# Patient Record
Sex: Male | Born: 1955 | Race: Black or African American | Hispanic: No | Marital: Married | Smoking: Never smoker
Health system: Southern US, Community
[De-identification: ages and names within clinical notes are randomized; demographics above are authoritative.]

## PROBLEM LIST (undated history)

## (undated) DIAGNOSIS — I10 Essential (primary) hypertension: Secondary | ICD-10-CM

## (undated) DIAGNOSIS — H409 Unspecified glaucoma: Secondary | ICD-10-CM

## (undated) HISTORY — PX: EYE SURGERY: SHX253

## (undated) HISTORY — PX: GLAUCOMA SURGERY: SHX656

---

## 2005-07-23 ENCOUNTER — Ambulatory Visit: Payer: Self-pay | Admitting: Internal Medicine

## 2005-09-07 ENCOUNTER — Ambulatory Visit: Payer: Self-pay | Admitting: Internal Medicine

## 2005-12-28 ENCOUNTER — Ambulatory Visit: Payer: Self-pay | Admitting: Internal Medicine

## 2006-05-13 ENCOUNTER — Ambulatory Visit: Payer: Self-pay | Admitting: Internal Medicine

## 2006-06-12 ENCOUNTER — Ambulatory Visit: Payer: Self-pay | Admitting: *Deleted

## 2006-06-18 ENCOUNTER — Ambulatory Visit: Payer: Self-pay | Admitting: Family Medicine

## 2006-07-03 ENCOUNTER — Ambulatory Visit: Payer: Self-pay | Admitting: Family Medicine

## 2007-01-29 ENCOUNTER — Encounter (INDEPENDENT_AMBULATORY_CARE_PROVIDER_SITE_OTHER): Payer: Self-pay | Admitting: *Deleted

## 2010-11-23 ENCOUNTER — Ambulatory Visit (INDEPENDENT_AMBULATORY_CARE_PROVIDER_SITE_OTHER): Payer: Self-pay | Admitting: General Surgery

## 2013-03-06 ENCOUNTER — Encounter (HOSPITAL_COMMUNITY): Payer: Self-pay | Admitting: Emergency Medicine

## 2013-03-06 ENCOUNTER — Emergency Department (HOSPITAL_COMMUNITY)
Admission: EM | Admit: 2013-03-06 | Discharge: 2013-03-06 | Disposition: A | Discharge status: Home / Self Care | Payer: Managed Care, Other (non HMO) | Attending: Emergency Medicine | Admitting: Emergency Medicine

## 2013-03-06 ENCOUNTER — Emergency Department (HOSPITAL_COMMUNITY): Payer: Managed Care, Other (non HMO)

## 2013-03-06 DIAGNOSIS — Z79899 Other long term (current) drug therapy: Secondary | ICD-10-CM | POA: Insufficient documentation

## 2013-03-06 DIAGNOSIS — S298XXA Other specified injuries of thorax, initial encounter: Secondary | ICD-10-CM | POA: Insufficient documentation

## 2013-03-06 DIAGNOSIS — Y9389 Activity, other specified: Secondary | ICD-10-CM | POA: Insufficient documentation

## 2013-03-06 DIAGNOSIS — I10 Essential (primary) hypertension: Secondary | ICD-10-CM | POA: Insufficient documentation

## 2013-03-06 DIAGNOSIS — Y92410 Unspecified street and highway as the place of occurrence of the external cause: Secondary | ICD-10-CM | POA: Insufficient documentation

## 2013-03-06 HISTORY — DX: Essential (primary) hypertension: I10
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
## 2013-03-07 NOTE — ED Provider Notes (Signed)
Medical screening examination/treatment/procedure(s) were performed by non-physician practitioner and as supervising physician I was immediately available for consultation/collaboration.    Tanairy Payeur, MD 03/07/13 0603 

## 2014-04-29 ENCOUNTER — Ambulatory Visit: Payer: Managed Care, Other (non HMO) | Admitting: Cardiovascular Disease

## 2015-04-06 ENCOUNTER — Other Ambulatory Visit: Payer: Self-pay | Admitting: Orthopedic Surgery

## 2015-04-18 NOTE — Pre-Procedure Instructions (Signed)
    [name]  04/18/2015      [id number] [name] [address] [facility] [id number] [name] [id number] Phone: [phone] Fax: [phone]    Your procedure is scheduled on 04/29/15.  [facility].M.  Call this number if you have problems the morning of surgery:  [phone]   Remember:  Do not eat food or drink liquids after midnight.  Take these medicines the morning of surgery with A SIP OF WATER --eye drops   Do not wear jewelry, make-up or nail polish.  Do not wear lotions, powders, or perfumes.  You may wear deodorant.  Do not shave 48 hours prior to surgery.  Men may shave face and neck.  Do not bring valuables to the hospital.  [facility] is not responsible for any belongings or valuables.  Contacts, dentures or bridgework may not be worn into surgery.  Leave your suitcase in the car.  After surgery it may be brought to your room.  For patients admitted to the hospital, discharge time will be determined by your treatment team.  Patients discharged the day of surgery will not be allowed to drive home.   Name and phone number of your driver:   Special instructions:    Please read over the following fact sheets that you were given. Pain Booklet, Coughing and Deep Breathing, MRSA Information and Surgical Site Infection Prevention

## 2015-04-19 ENCOUNTER — Encounter (HOSPITAL_COMMUNITY)
Admission: RE | Admit: 2015-04-19 | Discharge: 2015-04-19 | Disposition: A | Discharge status: Home / Self Care | Payer: BLUE CROSS/BLUE SHIELD | Source: Ambulatory Visit | Attending: Orthopedic Surgery | Admitting: Orthopedic Surgery

## 2015-04-19 ENCOUNTER — Encounter (HOSPITAL_COMMUNITY): Payer: Self-pay

## 2015-04-19 ENCOUNTER — Other Ambulatory Visit: Payer: Self-pay

## 2015-04-19 DIAGNOSIS — Z01810 Encounter for preprocedural cardiovascular examination: Secondary | ICD-10-CM | POA: Insufficient documentation

## 2015-04-19 DIAGNOSIS — Z01818 Encounter for other preprocedural examination: Secondary | ICD-10-CM | POA: Diagnosis present

## 2015-04-19 DIAGNOSIS — Z01812 Encounter for preprocedural laboratory examination: Secondary | ICD-10-CM | POA: Insufficient documentation

## 2015-04-19 HISTORY — DX: Unspecified glaucoma: H40.9

## 2015-04-19 LAB — CBC WITH DIFFERENTIAL/PLATELET
BASOS ABS: 0.1 10*3/uL (ref 0.0–0.1)
BASOS PCT: 1 %
Eosinophils Absolute: 0.5 10*3/uL (ref 0.0–0.7)
Eosinophils Relative: 9 %
HCT: 43.1 % (ref 39.0–52.0)
HEMOGLOBIN: 14.6 g/dL (ref 13.0–17.0)
Lymphocytes Relative: 39 %
Lymphs Abs: 2.3 10*3/uL (ref 0.7–4.0)
MCH: 28.2 pg (ref 26.0–34.0)
MCHC: 33.9 g/dL (ref 30.0–36.0)
MCV: 83.4 fL (ref 78.0–100.0)
Monocytes Absolute: 0.5 10*3/uL (ref 0.1–1.0)
Monocytes Relative: 8 %
NEUTROS ABS: 2.6 10*3/uL (ref 1.7–7.7)
NEUTROS PCT: 43 %
Platelets: 172 10*3/uL (ref 150–400)
RBC: 5.17 MIL/uL (ref 4.22–5.81)
RDW: 13 % (ref 11.5–15.5)
WBC: 6 10*3/uL (ref 4.0–10.5)

## 2015-04-19 LAB — ABO/RH: ABO/RH(D): O POS

## 2015-04-19 LAB — TYPE AND SCREEN
ABO/RH(D): O POS
Antibody Screen: NEGATIVE

## 2015-04-19 LAB — URINALYSIS, ROUTINE W REFLEX MICROSCOPIC
Bilirubin Urine: NEGATIVE
GLUCOSE, UA: NEGATIVE mg/dL
Hgb urine dipstick: NEGATIVE
KETONES UR: NEGATIVE mg/dL
Leukocytes, UA: NEGATIVE
Nitrite: NEGATIVE
PH: 5.5 (ref 5.0–8.0)
Protein, ur: NEGATIVE mg/dL
Specific Gravity, Urine: 1.026 (ref 1.005–1.030)

## 2015-04-19 LAB — COMPREHENSIVE METABOLIC PANEL
ALBUMIN: 3.8 g/dL (ref 3.5–5.0)
ALK PHOS: 80 U/L (ref 38–126)
ALT: 28 U/L (ref 17–63)
AST: 36 U/L (ref 15–41)
Anion gap: 7 (ref 5–15)
BUN: 17 mg/dL (ref 6–20)
CO2: 27 mmol/L (ref 22–32)
Calcium: 9.9 mg/dL (ref 8.9–10.3)
Chloride: 106 mmol/L (ref 101–111)
Creatinine, Ser: 0.89 mg/dL (ref 0.61–1.24)
GFR calc Af Amer: >60 mL/min (ref >60)
Glucose, Bld: 108 mg/dL — ABNORMAL HIGH (ref 65–99)
POTASSIUM: 3.7 mmol/L (ref 3.5–5.1)
Sodium: 140 mmol/L (ref 135–145)
Total Bilirubin: 0.6 mg/dL (ref 0.3–1.2)
Total Protein: 7.1 g/dL (ref 6.5–8.1)

## 2015-04-19 LAB — PROTIME-INR
INR: 1.14 (ref 0.00–1.49)
PROTHROMBIN TIME: 14.7 s (ref 11.6–15.2)

## 2015-04-19 LAB — APTT: APTT: 30 s (ref 24–37)

## 2015-04-19 LAB — SURGICAL PCR SCREEN
MRSA, PCR: NEGATIVE
Staphylococcus aureus: POSITIVE — AB

## 2015-04-20 ENCOUNTER — Other Ambulatory Visit: Payer: Self-pay | Admitting: Orthopedic Surgery

## 2015-04-20 DIAGNOSIS — R9389 Abnormal findings on diagnostic imaging of other specified body structures: Secondary | ICD-10-CM

## 2015-04-28 ENCOUNTER — Ambulatory Visit
Admission: RE | Admit: 2015-04-28 | Discharge: 2015-04-28 | Disposition: A | Discharge status: Home / Self Care | Payer: BLUE CROSS/BLUE SHIELD | Source: Ambulatory Visit | Attending: Orthopedic Surgery | Admitting: Orthopedic Surgery

## 2015-04-28 DIAGNOSIS — R9389 Abnormal findings on diagnostic imaging of other specified body structures: Secondary | ICD-10-CM

## 2015-04-28 MED ORDER — IOPAMIDOL (ISOVUE-300) INJECTION 61%
75.0000 mL | Freq: Once | INTRAVENOUS | Status: AC | PRN
  Administered 2015-04-28: 75 mL via INTRAVENOUS

## 2015-04-29 ENCOUNTER — Encounter (HOSPITAL_COMMUNITY): Payer: Self-pay

## 2015-04-29 ENCOUNTER — Inpatient Hospital Stay (HOSPITAL_COMMUNITY)
Admission: RE | Admit: 2015-04-29 | Discharge: 2015-04-30 | DRG: 470 | Disposition: A | Discharge status: Home Health | Payer: BLUE CROSS/BLUE SHIELD | Source: Ambulatory Visit | Attending: Orthopedic Surgery | Admitting: Orthopedic Surgery

## 2015-04-29 ENCOUNTER — Encounter (HOSPITAL_COMMUNITY)
Admission: RE | Disposition: A | Discharge status: Home Health | Payer: Self-pay | Source: Ambulatory Visit | Attending: Orthopedic Surgery

## 2015-04-29 ENCOUNTER — Inpatient Hospital Stay (HOSPITAL_COMMUNITY): Payer: BLUE CROSS/BLUE SHIELD | Admitting: Anesthesiology

## 2015-04-29 DIAGNOSIS — I10 Essential (primary) hypertension: Secondary | ICD-10-CM | POA: Diagnosis present

## 2015-04-29 DIAGNOSIS — H409 Unspecified glaucoma: Secondary | ICD-10-CM | POA: Diagnosis present

## 2015-04-29 DIAGNOSIS — M1712 Unilateral primary osteoarthritis, left knee: Secondary | ICD-10-CM | POA: Diagnosis present

## 2015-04-29 DIAGNOSIS — M25562 Pain in left knee: Secondary | ICD-10-CM | POA: Diagnosis present

## 2015-04-29 HISTORY — PX: TOTAL KNEE ARTHROPLASTY: SHX125

## 2015-04-29 SURGERY — ARTHROPLASTY, KNEE, TOTAL
Anesthesia: Monitor Anesthesia Care | Site: Knee | Laterality: Left

## 2015-04-29 MED ORDER — BUPIVACAINE LIPOSOME 1.3 % IJ SUSP
20.0000 mL | INTRAMUSCULAR | Status: DC
  Filled 2015-04-29: qty 20

## 2015-04-29 MED ORDER — OXYCODONE-ACETAMINOPHEN 5-325 MG PO TABS: 1.0000 | ORAL_TABLET | ORAL | Status: AC | PRN

## 2015-04-29 MED ORDER — DIPHENHYDRAMINE HCL 12.5 MG/5ML PO ELIX: 12.5000 mg | ORAL_SOLUTION | ORAL | Status: DC | PRN

## 2015-04-29 MED ORDER — ACETAMINOPHEN 325 MG PO TABS: 650.0000 mg | ORAL_TABLET | Freq: Four times a day (QID) | ORAL | Status: DC | PRN

## 2015-04-29 MED ORDER — CEFAZOLIN SODIUM-DEXTROSE 2-3 GM-% IV SOLR
2.0000 g | Freq: Four times a day (QID) | INTRAVENOUS | Status: AC
  Administered 2015-04-29 (×2): 2 g via INTRAVENOUS
  Filled 2015-04-29: qty 50

## 2015-04-29 MED ORDER — DOCUSATE SODIUM 100 MG PO CAPS
100.0000 mg | ORAL_CAPSULE | Freq: Two times a day (BID) | ORAL | Status: DC
  Administered 2015-04-29 – 2015-04-30 (×2): 100 mg via ORAL
  Filled 2015-04-29 (×2): qty 1

## 2015-04-29 MED ORDER — PROMETHAZINE HCL 25 MG/ML IJ SOLN: 12.5000 mg | Freq: Four times a day (QID) | INTRAMUSCULAR | Status: DC | PRN

## 2015-04-29 MED ORDER — BUPIVACAINE HCL (PF) 0.5 % IJ SOLN
INTRAMUSCULAR | Status: DC | PRN
  Administered 2015-04-29: 3 mL via INTRATHECAL

## 2015-04-29 MED ORDER — PROPOFOL 10 MG/ML IV BOLUS
INTRAVENOUS | Status: AC
  Filled 2015-04-29: qty 20

## 2015-04-29 MED ORDER — ONDANSETRON HCL 4 MG/2ML IJ SOLN: 4.0000 mg | Freq: Four times a day (QID) | INTRAMUSCULAR | Status: DC | PRN

## 2015-04-29 MED ORDER — ACETAMINOPHEN 650 MG RE SUPP: 650.0000 mg | Freq: Four times a day (QID) | RECTAL | Status: DC | PRN

## 2015-04-29 MED ORDER — ZOLPIDEM TARTRATE 5 MG PO TABS: 5.0000 mg | ORAL_TABLET | Freq: Every evening | ORAL | Status: DC | PRN

## 2015-04-29 MED ORDER — MIDAZOLAM HCL 2 MG/2ML IJ SOLN
INTRAMUSCULAR | Status: AC
  Filled 2015-04-29: qty 2

## 2015-04-29 MED ORDER — KETOROLAC TROMETHAMINE 15 MG/ML IJ SOLN
INTRAMUSCULAR | Status: AC
  Filled 2015-04-29: qty 1

## 2015-04-29 MED ORDER — MIDAZOLAM HCL 5 MG/5ML IJ SOLN
INTRAMUSCULAR | Status: DC | PRN
  Administered 2015-04-29: 2 mg via INTRAVENOUS

## 2015-04-29 MED ORDER — FENTANYL CITRATE (PF) 250 MCG/5ML IJ SOLN
INTRAMUSCULAR | Status: AC
  Filled 2015-04-29: qty 5

## 2015-04-29 MED ORDER — DORZOLAMIDE HCL-TIMOLOL MAL 2-0.5 % OP SOLN
1.0000 [drp] | Freq: Two times a day (BID) | OPHTHALMIC | Status: DC
  Administered 2015-04-29 – 2015-04-30 (×2): 1 [drp] via OPHTHALMIC
  Filled 2015-04-29: qty 10

## 2015-04-29 MED ORDER — ONDANSETRON HCL 4 MG PO TABS: 4.0000 mg | ORAL_TABLET | Freq: Four times a day (QID) | ORAL | Status: DC | PRN

## 2015-04-29 MED ORDER — FENTANYL CITRATE (PF) 100 MCG/2ML IJ SOLN
INTRAMUSCULAR | Status: AC
  Filled 2015-04-29: qty 2

## 2015-04-29 MED ORDER — ONDANSETRON HCL 4 MG/2ML IJ SOLN
INTRAMUSCULAR | Status: DC | PRN
  Administered 2015-04-29: 4 mg via INTRAVENOUS

## 2015-04-29 MED ORDER — LIDOCAINE HCL (CARDIAC) 20 MG/ML IV SOLN
INTRAVENOUS | Status: AC
  Filled 2015-04-29: qty 5

## 2015-04-29 MED ORDER — LACTATED RINGERS IV SOLN
INTRAVENOUS | Status: DC | PRN
  Administered 2015-04-29 (×2): via INTRAVENOUS

## 2015-04-29 MED ORDER — METHOCARBAMOL 750 MG PO TABS: 750.0000 mg | ORAL_TABLET | Freq: Three times a day (TID) | ORAL | Status: AC | PRN

## 2015-04-29 MED ORDER — POLYETHYLENE GLYCOL 3350 17 G PO PACK
17.0000 g | PACK | Freq: Every day | ORAL | Status: DC | PRN
  Filled 2015-04-29: qty 1

## 2015-04-29 MED ORDER — SODIUM CHLORIDE 0.9 % IR SOLN
Status: DC | PRN
  Administered 2015-04-29: 1000 mL

## 2015-04-29 MED ORDER — CHLORHEXIDINE GLUCONATE 4 % EX LIQD: 60.0000 mL | Freq: Once | CUTANEOUS | Status: DC

## 2015-04-29 MED ORDER — MAGNESIUM CITRATE PO SOLN: 1.0000 | Freq: Once | ORAL | Status: DC | PRN

## 2015-04-29 MED ORDER — BISACODYL 5 MG PO TBEC
5.0000 mg | DELAYED_RELEASE_TABLET | Freq: Every day | ORAL | Status: DC | PRN
  Filled 2015-04-29: qty 1

## 2015-04-29 MED ORDER — BUPIVACAINE HCL (PF) 0.5 % IJ SOLN
INTRAMUSCULAR | Status: DC | PRN
  Administered 2015-04-29: 20 mL

## 2015-04-29 MED ORDER — METHOCARBAMOL 500 MG PO TABS
500.0000 mg | ORAL_TABLET | Freq: Four times a day (QID) | ORAL | Status: DC | PRN
  Administered 2015-04-30: 500 mg via ORAL
  Filled 2015-04-29: qty 1

## 2015-04-29 MED ORDER — BUPIVACAINE HCL (PF) 0.5 % IJ SOLN
INTRAMUSCULAR | Status: AC
  Filled 2015-04-29: qty 30

## 2015-04-29 MED ORDER — HYDROMORPHONE HCL 1 MG/ML IJ SOLN
1.0000 mg | INTRAMUSCULAR | Status: DC | PRN
  Administered 2015-04-30: 1 mg via INTRAVENOUS
  Filled 2015-04-29 (×2): qty 2

## 2015-04-29 MED ORDER — ONDANSETRON HCL 4 MG/2ML IJ SOLN
INTRAMUSCULAR | Status: AC
  Filled 2015-04-29: qty 2

## 2015-04-29 MED ORDER — ROCURONIUM BROMIDE 50 MG/5ML IV SOLN
INTRAVENOUS | Status: AC
  Filled 2015-04-29: qty 1

## 2015-04-29 MED ORDER — 0.9 % SODIUM CHLORIDE (POUR BTL) OPTIME
TOPICAL | Status: DC | PRN
  Administered 2015-04-29: 1000 mL

## 2015-04-29 MED ORDER — LISINOPRIL 20 MG PO TABS
20.0000 mg | ORAL_TABLET | Freq: Every day | ORAL | Status: DC
  Administered 2015-04-29 – 2015-04-30 (×2): 20 mg via ORAL
  Filled 2015-04-29 (×2): qty 1

## 2015-04-29 MED ORDER — OXYCODONE-ACETAMINOPHEN 5-325 MG PO TABS
1.0000 | ORAL_TABLET | ORAL | Status: DC | PRN
  Administered 2015-04-30 (×2): 2 via ORAL
  Filled 2015-04-29 (×2): qty 2

## 2015-04-29 MED ORDER — CEFAZOLIN SODIUM-DEXTROSE 2-3 GM-% IV SOLR
2.0000 g | INTRAVENOUS | Status: AC
  Administered 2015-04-29: 2 g via INTRAVENOUS
  Filled 2015-04-29: qty 50

## 2015-04-29 MED ORDER — DEXAMETHASONE SODIUM PHOSPHATE 10 MG/ML IJ SOLN
10.0000 mg | Freq: Two times a day (BID) | INTRAMUSCULAR | Status: AC
  Administered 2015-04-29 – 2015-04-30 (×2): 10 mg via INTRAVENOUS
  Filled 2015-04-29 (×2): qty 1

## 2015-04-29 MED ORDER — METHOCARBAMOL 1000 MG/10ML IJ SOLN
500.0000 mg | Freq: Four times a day (QID) | INTRAVENOUS | Status: DC | PRN
  Filled 2015-04-29: qty 5

## 2015-04-29 MED ORDER — PROMETHAZINE HCL 25 MG/ML IJ SOLN: 6.2500 mg | INTRAMUSCULAR | Status: DC | PRN

## 2015-04-29 MED ORDER — TRANEXAMIC ACID 1000 MG/10ML IV SOLN
1000.0000 mg | INTRAVENOUS | Status: AC
  Administered 2015-04-29: 1000 mg via INTRAVENOUS
  Filled 2015-04-29: qty 10

## 2015-04-29 MED ORDER — ASPIRIN EC 325 MG PO TBEC: 325.0000 mg | DELAYED_RELEASE_TABLET | Freq: Two times a day (BID) | ORAL | Status: AC

## 2015-04-29 MED ORDER — SODIUM CHLORIDE 0.9 % IV SOLN
INTRAVENOUS | Status: DC
  Administered 2015-04-29: 21:00:00 via INTRAVENOUS

## 2015-04-29 MED ORDER — ALUM & MAG HYDROXIDE-SIMETH 200-200-20 MG/5ML PO SUSP: 30.0000 mL | ORAL | Status: DC | PRN

## 2015-04-29 MED ORDER — ASPIRIN EC 325 MG PO TBEC
325.0000 mg | DELAYED_RELEASE_TABLET | Freq: Two times a day (BID) | ORAL | Status: DC
  Administered 2015-04-29 – 2015-04-30 (×2): 325 mg via ORAL
  Filled 2015-04-29 (×2): qty 1

## 2015-04-29 MED ORDER — LACTATED RINGERS IV SOLN
INTRAVENOUS | Status: DC
  Administered 2015-04-29: 09:00:00 via INTRAVENOUS

## 2015-04-29 MED ORDER — FENTANYL CITRATE (PF) 100 MCG/2ML IJ SOLN: 25.0000 ug | INTRAMUSCULAR | Status: DC | PRN

## 2015-04-29 MED ORDER — BUPIVACAINE LIPOSOME 1.3 % IJ SUSP
INTRAMUSCULAR | Status: DC | PRN
  Administered 2015-04-29: 20 mL

## 2015-04-29 MED ORDER — PROPOFOL 500 MG/50ML IV EMUL
INTRAVENOUS | Status: DC | PRN
  Administered 2015-04-29: 50 ug/kg/min via INTRAVENOUS

## 2015-04-29 MED ORDER — KETOROLAC TROMETHAMINE 15 MG/ML IJ SOLN
15.0000 mg | Freq: Three times a day (TID) | INTRAMUSCULAR | Status: DC
  Administered 2015-04-29 – 2015-04-30 (×3): 15 mg via INTRAVENOUS
  Filled 2015-04-29 (×2): qty 1

## 2015-04-29 SURGICAL SUPPLY — 68 items
APL SKNCLS STERI-STRIP NONHPOA (GAUZE/BANDAGES/DRESSINGS) ×1
BANDAGE ELASTIC 6 VELCRO ST LF (GAUZE/BANDAGES/DRESSINGS) ×2 IMPLANT
BANDAGE ESMARK 6X9 LF (GAUZE/BANDAGES/DRESSINGS) ×1 IMPLANT
BENZOIN TINCTURE PRP APPL 2/3 (GAUZE/BANDAGES/DRESSINGS) ×3 IMPLANT
BLADE SAGITTAL 25.0X1.19X90 (BLADE) ×2 IMPLANT
BLADE SAGITTAL 25.0X1.19X90MM (BLADE) ×1
BLADE SAW SAG 90X13X1.27 (BLADE) ×3 IMPLANT
BNDG CMPR 9X6 STRL LF SNTH (GAUZE/BANDAGES/DRESSINGS) ×1
BNDG ESMARK 6X9 LF (GAUZE/BANDAGES/DRESSINGS) ×3
BOWL SMART MIX CTS (DISPOSABLE) ×3 IMPLANT
CAP KNEE TOTAL 3 SIGMA ×2 IMPLANT
CEMENT HV SMART SET (Cement) ×6 IMPLANT
CLOSURE WOUND 1/2 X4 (GAUZE/BANDAGES/DRESSINGS) ×2
COVER SURGICAL LIGHT HANDLE (MISCELLANEOUS) ×3 IMPLANT
CUFF TOURNIQUET SINGLE 34IN LL (TOURNIQUET CUFF) ×3 IMPLANT
CUFF TOURNIQUET SINGLE 44IN (TOURNIQUET CUFF) IMPLANT
DRAPE EXTREMITY T 121X128X90 (DRAPE) ×3 IMPLANT
DRAPE IMP U-DRAPE 54X76 (DRAPES) ×3 IMPLANT
DRAPE U-SHAPE 47X51 STRL (DRAPES) ×3 IMPLANT
DRSG AQUACEL AG ADV 3.5X10 (GAUZE/BANDAGES/DRESSINGS) ×2 IMPLANT
DRSG MEPILEX BORDER 4X12 (GAUZE/BANDAGES/DRESSINGS) ×3 IMPLANT
DRSG PAD ABDOMINAL 8X10 ST (GAUZE/BANDAGES/DRESSINGS) ×3 IMPLANT
DURAPREP 26ML APPLICATOR (WOUND CARE) ×3 IMPLANT
ELECT CAUTERY BLADE 6.4 (BLADE) ×2 IMPLANT
ELECT REM PT RETURN 9FT ADLT (ELECTROSURGICAL) ×3
ELECTRODE REM PT RTRN 9FT ADLT (ELECTROSURGICAL) ×1 IMPLANT
EVACUATOR 1/8 PVC DRAIN (DRAIN) ×3 IMPLANT
FACESHIELD WRAPAROUND (MASK) ×3 IMPLANT
FACESHIELD WRAPAROUND OR TEAM (MASK) ×1 IMPLANT
GAUZE SPONGE 4X4 12PLY STRL (GAUZE/BANDAGES/DRESSINGS) ×3 IMPLANT
GLOVE BIOGEL PI IND STRL 8 (GLOVE) ×2 IMPLANT
GLOVE BIOGEL PI INDICATOR 8 (GLOVE) ×4
GLOVE ECLIPSE 7.5 STRL STRAW (GLOVE) ×6 IMPLANT
GOWN STRL REUS W/ TWL LRG LVL3 (GOWN DISPOSABLE) ×1 IMPLANT
GOWN STRL REUS W/ TWL XL LVL3 (GOWN DISPOSABLE) ×2 IMPLANT
GOWN STRL REUS W/TWL LRG LVL3 (GOWN DISPOSABLE) ×3
GOWN STRL REUS W/TWL XL LVL3 (GOWN DISPOSABLE) ×6
HANDPIECE INTERPULSE COAX TIP (DISPOSABLE) ×3
HOOD PEEL AWAY FACE SHEILD DIS (HOOD) ×9 IMPLANT
IMMOBILIZER KNEE 20 (SOFTGOODS) IMPLANT
IMMOBILIZER KNEE 22 (SOFTGOODS) ×2 IMPLANT
IMMOBILIZER KNEE 22 UNIV (SOFTGOODS) ×3 IMPLANT
KIT BASIN OR (CUSTOM PROCEDURE TRAY) ×3 IMPLANT
KIT ROOM TURNOVER OR (KITS) ×3 IMPLANT
MANIFOLD NEPTUNE II (INSTRUMENTS) ×3 IMPLANT
NDL SPNL 22GX3.5 QUINCKE BK (NEEDLE) ×1 IMPLANT
NEEDLE SPNL 22GX3.5 QUINCKE BK (NEEDLE) ×3 IMPLANT
NS IRRIG 1000ML POUR BTL (IV SOLUTION) ×3 IMPLANT
PACK TOTAL JOINT (CUSTOM PROCEDURE TRAY) ×3 IMPLANT
PACK UNIVERSAL I (CUSTOM PROCEDURE TRAY) ×3 IMPLANT
PAD ARMBOARD 7.5X6 YLW CONV (MISCELLANEOUS) ×6 IMPLANT
PAD CAST 4YDX4 CTTN HI CHSV (CAST SUPPLIES) ×1 IMPLANT
PADDING CAST COTTON 4X4 STRL (CAST SUPPLIES) ×3
PADDING CAST COTTON 6X4 STRL (CAST SUPPLIES) ×2 IMPLANT
SET HNDPC FAN SPRY TIP SCT (DISPOSABLE) ×1 IMPLANT
STAPLER VISISTAT 35W (STAPLE) IMPLANT
STRIP CLOSURE SKIN 1/2X4 (GAUZE/BANDAGES/DRESSINGS) ×4 IMPLANT
SUCTION FRAZIER TIP 10 FR DISP (SUCTIONS) ×3 IMPLANT
SUT MNCRL AB 3-0 PS2 18 (SUTURE) IMPLANT
SUT VIC AB 0 CTB1 27 (SUTURE) ×6 IMPLANT
SUT VIC AB 1 CT1 27 (SUTURE) ×6
SUT VIC AB 1 CT1 27XBRD ANBCTR (SUTURE) ×2 IMPLANT
SUT VIC AB 2-0 CTB1 (SUTURE) ×6 IMPLANT
SYR 50ML LL SCALE MARK (SYRINGE) ×3 IMPLANT
TOWEL OR 17X24 6PK STRL BLUE (TOWEL DISPOSABLE) ×3 IMPLANT
TOWEL OR 17X26 10 PK STRL BLUE (TOWEL DISPOSABLE) ×3 IMPLANT
TRAY FOLEY CATH 16FRSI W/METER (SET/KITS/TRAYS/PACK) IMPLANT
WRAP KNEE MAXI GEL POST OP (GAUZE/BANDAGES/DRESSINGS) ×3 IMPLANT

## 2015-04-29 NOTE — H&P (Addendum)
TOTAL KNEE ADMISSION H&P  Patient is being admitted for left total knee arthroplasty.  Subjective:  Chief Complaint:left knee pain.  HPI: [name], 59 y.o. male, has a history of pain and functional disability in the left knee due to arthritis and has failed non-surgical conservative treatments for greater than 12 weeks to includeNSAID's and/or analgesics, corticosteriod injections, viscosupplementation injections, flexibility and strengthening excercises, use of assistive devices and activity modification.  Onset of symptoms was gradual, starting 7 years ago with gradually worsening course since that time. The patient noted prior procedures on the knee to include  arthroscopy and menisectomy on the left knee(s).  Patient currently rates pain in the left knee(s) at 8 out of 10 with activity. Patient has night pain, worsening of pain with activity and weight bearing, pain that interferes with activities of daily living, pain with passive range of motion, crepitus and joint swelling.  Patient has evidence of subchondral cysts, subchondral sclerosis, periarticular osteophytes, joint subluxation and joint space narrowing by imaging studies. This patient has had failure of all reasonable conservative care. There is no active infection.  There are no active problems to display for this patient.  Past Medical History  Diagnosis Date  . Hypertension   . Glaucoma     Past Surgical History  Procedure Laterality Date  . Glaucoma surgery    . Eye surgery      No prescriptions prior to admission   No Known Allergies  Social History  Substance Use Topics  . Smoking status: Never Smoker   . Smokeless tobacco: Not on file  . Alcohol Use: No    No family history on file.   ROS ROS: I have reviewed the patient's review of systems thoroughly and there are no positive responses as relates to the HPI. Objective:  Physical Exam Well-developed well-nourished patient in no acute  distress. Alert and oriented x3 HEENT:within normal limits Cardiac: Regular rate and rhythm Pulmonary: Lungs clear to auscultation Abdomen: Soft and nontender.  Normal active bowel sounds  Musculoskeletal: (left knee: Severe grinding through range of motion.  Moderate soft tissue swelling.  2+ effusion.  Limited range of motion 5-95. Vital signs in last 24 hours:    Labs: Recent Results (from the past 2160 hour(s))  Urinalysis, Routine w reflex microscopic (not at [facility])     Status: None   Collection Time: 04/19/15  9:43 AM  Result Value Ref Range   Color, Urine YELLOW YELLOW   APPearance CLEAR CLEAR   Specific Gravity, Urine 1.026 1.005 - 1.030   pH 5.5 5.0 - 8.0   Glucose, UA NEGATIVE NEGATIVE mg/dL   Hgb urine dipstick NEGATIVE NEGATIVE   Bilirubin Urine NEGATIVE NEGATIVE   Ketones, ur NEGATIVE NEGATIVE mg/dL   Protein, ur NEGATIVE NEGATIVE mg/dL   Nitrite NEGATIVE NEGATIVE   Leukocytes, UA NEGATIVE NEGATIVE    Comment: MICROSCOPIC NOT DONE ON URINES WITH NEGATIVE PROTEIN, BLOOD, LEUKOCYTES, NITRITE, OR GLUCOSE <1000 mg/dL.  APTT     Status: None   Collection Time: 04/19/15  9:44 AM  Result Value Ref Range   aPTT 30 24 - 37 seconds  CBC WITH DIFFERENTIAL     Status: None   Collection Time: 04/19/15  9:44 AM  Result Value Ref Range   WBC 6.0 4.0 - 10.5 K/uL   RBC 5.17 4.22 - 5.81 MIL/uL   Hemoglobin 14.6 13.0 - 17.0 g/dL   HCT 43.1 39.0 - 52.0 %   MCV 83.4 78.0 - 100.0 fL  MCH 28.2 26.0 - 34.0 pg   MCHC 33.9 30.0 - 36.0 g/dL   RDW 13.0 11.5 - 15.5 %   Platelets 172 150 - 400 K/uL   Neutrophils Relative % 43 %   Neutro Abs 2.6 1.7 - 7.7 K/uL   Lymphocytes Relative 39 %   Lymphs Abs 2.3 0.7 - 4.0 K/uL   Monocytes Relative 8 %   Monocytes Absolute 0.5 0.1 - 1.0 K/uL   Eosinophils Relative 9 %   Eosinophils Absolute 0.5 0.0 - 0.7 K/uL   Basophils Relative 1 %   Basophils Absolute 0.1 0.0 - 0.1 K/uL  Comprehensive metabolic panel     Status: Abnormal   Collection  Time: 04/19/15  9:44 AM  Result Value Ref Range   Sodium 140 135 - 145 mmol/L   Potassium 3.7 3.5 - 5.1 mmol/L   Chloride 106 101 - 111 mmol/L   CO2 27 22 - 32 mmol/L   Glucose, Bld 108 (H) 65 - 99 mg/dL   BUN 17 6 - 20 mg/dL   Creatinine, Ser 0.89 0.61 - 1.24 mg/dL   Calcium 9.9 8.9 - 10.3 mg/dL   Total Protein 7.1 6.5 - 8.1 g/dL   Albumin 3.8 3.5 - 5.0 g/dL   AST 36 15 - 41 U/L   ALT 28 17 - 63 U/L   Alkaline Phosphatase 80 38 - 126 U/L   Total Bilirubin 0.6 0.3 - 1.2 mg/dL   GFR calc non Af Amer >60 >60 mL/min   GFR calc Af Amer >60 >60 mL/min    Comment: (NOTE) The eGFR has been calculated using the CKD EPI equation. This calculation has not been validated in all clinical situations. eGFR's persistently <60 mL/min signify possible Chronic Kidney Disease.    Anion gap 7 5 - 15  Protime-INR     Status: None   Collection Time: 04/19/15  9:44 AM  Result Value Ref Range   Prothrombin Time 14.7 11.6 - 15.2 seconds   INR 1.14 0.00 - 1.49  Surgical pcr screen     Status: Abnormal   Collection Time: 04/19/15  9:44 AM  Result Value Ref Range   MRSA, PCR NEGATIVE NEGATIVE   Staphylococcus aureus POSITIVE (A) NEGATIVE    Comment:        The Xpert SA Assay (FDA approved for NASAL specimens in patients over 57 years of age), is one component of a comprehensive surveillance program.  Test performance has been validated by [facility] for patients greater than or equal to 72 year old. It is not intended to diagnose infection nor to guide or monitor treatment.   Type and screen Order type and screen if day of surgery is less than 15 days from draw of preadmission visit or order morning of surgery if day of surgery is greater than 6 days from preadmission visit.     Status: None   Collection Time: 04/19/15  9:55 AM  Result Value Ref Range   ABO/RH(D) O POS    Antibody Screen NEG    Sample Expiration 05/03/2015    Extend sample reason NO TRANSFUSIONS OR PREGNANCY IN THE PAST 3  MONTHS   ABO/Rh     Status: None   Collection Time: 04/19/15  9:55 AM  Result Value Ref Range   ABO/RH(D) O POS     Estimated body mass index is 22.96 kg/(m^2) as calculated from the following:   Height as of 03/06/13: 5' 10"  (1.778 m).   Weight as of  03/06/13: 72.576 kg (160 lb).   Imaging Review Plain radiographs demonstrate severe degenerative joint disease of the left knee(s). The overall alignment ismild varus. The bone quality appears to be fair for age and reported activity level.  Assessment/Plan:  End stage arthritis, left knee   The patient history, physical examination, clinical judgment of the provider and imaging studies are consistent with end stage degenerative joint disease of the left knee(s) and total knee arthroplasty is deemed medically necessary. The treatment options including medical management, injection therapy arthroscopy and arthroplasty were discussed at length. The risks and benefits of total knee arthroplasty were presented and reviewed. The risks due to aseptic loosening, infection, stiffness, patella tracking problems, thromboembolic complications and other imponderables were discussed. The patient acknowledged the explanation, agreed to proceed with the plan and consent was signed. Patient is being admitted for inpatient treatment for surgery, pain control, PT, OT, prophylactic antibiotics, VTE prophylaxis, progressive ambulation and ADL's and discharge planning. The patient is planning to be discharged home with home health services  Filed Vitals:   04/29/15 0844 04/29/15 0847  BP: 166/94 153/85  Pulse:    Temp:    Resp:

## 2015-04-29 NOTE — Progress Notes (Signed)
Orthopedic Tech Progress Note Patient Details:  [name] [birth year] [id number]  CPM Left Knee CPM Left Knee: On Left Knee Flexion (Degrees): 90 Left Knee Extension (Degrees): 0 Additional Comments: trapeze bar patient helper Viewed order from doctor's order list  Nikki DomCrawford, Rica Heather 04/29/2015, 1:53 PM

## 2015-04-29 NOTE — Transfer of Care (Signed)
Immediate Anesthesia Transfer of Care Note  Patient: [name]  Procedure(s) Performed: Procedure(s): LEFT TOTAL KNEE ARTHROPLASTY (Left)  Patient Location: PACU  Anesthesia Type:Spinal  Level of Consciousness: awake, alert , oriented and patient cooperative  Airway & Oxygen Therapy: Patient Spontanous Breathing  Post-op Assessment: Report given to RN, Post -op Vital signs reviewed and stable and Patient moving all extremities  Post vital signs: Reviewed and stable  Last Vitals:  Filed Vitals:   04/29/15 0844 04/29/15 0847  BP: 166/94 153/85  Pulse:    Temp:    Resp:      Complications: No apparent anesthesia complications

## 2015-04-29 NOTE — Progress Notes (Signed)
Pt stated he needed to empty bladder and was unable. Bladder scan done. Showed large amount of urine present. In and out cath done and obtained. 1000 cc clear yellow urine. Pt states feels better.

## 2015-04-29 NOTE — Anesthesia Procedure Notes (Addendum)
Spinal Patient location during procedure: OR Staffing Anesthesiologist: Cecile HearingURK, STEPHEN EDWARD Performed by: anesthesiologist  Preanesthetic Checklist Completed: patient identified, surgical consent, pre-op evaluation, timeout performed, IV checked, risks and benefits discussed and monitors and equipment checked Spinal Block Patient position: sitting Prep: site prepped and draped and DuraPrep Patient monitoring: continuous pulse ox and blood pressure Approach: midline Location: L3-4 Needle Needle type: Quincke  Needle gauge: 22 G Additional Notes Functioning IV was confirmed and monitors were applied. Sterile prep and drape, including hand hygiene, mask and sterile gloves were used. The patient was positioned and the spine was prepped. The skin was anesthetized with lidocaine.  Free flow of clear CSF was obtained prior to injecting local anesthetic into the CSF.  The spinal needle aspirated freely following injection.  The needle was carefully withdrawn.  The patient tolerated the procedure well. Consent was obtained prior to procedure with all questions answered and concerns addressed. Risks including but not limited to bleeding, infection, nerve damage, paralysis, failed block, inadequate analgesia, allergic reaction, high spinal, itching and headache were discussed and the patient wished to proceed.   Arrie AranStephen Turk, MD  [facility] Date/Time: 04/29/2015 10:10 AM Performed by: Coralee RudFLORES, Correne Lalani Comments: Spinal anesthesia.  Natural airway, spontaneous breathing

## 2015-04-29 NOTE — Discharge Instructions (Signed)

## 2015-04-29 NOTE — Anesthesia Preprocedure Evaluation (Addendum)
Anesthesia Evaluation  Patient identified by MRN, date of birth, ID band Patient awake    Reviewed: Allergy & Precautions, NPO status , Patient's Chart, lab work & pertinent test results  Airway Mallampati: II  TM Distance: >3 FB Neck ROM: Full    Dental  (+) Teeth Intact, Dental Advisory Given, Missing   Pulmonary neg pulmonary ROS,    Pulmonary exam normal breath sounds clear to auscultation       Cardiovascular Exercise Tolerance: Good hypertension, Pt. on medications (-) angina(-) CAD and (-) Past MI Normal cardiovascular exam Rhythm:Regular Rate:Normal     Neuro/Psych negative neurological ROS  negative psych ROS   GI/Hepatic negative GI ROS, Neg liver ROS,   Endo/Other  negative endocrine ROS  Renal/GU negative Renal ROS     Musculoskeletal negative musculoskeletal ROS (+)   Abdominal   Peds  Hematology negative hematology ROS (+) Plt 172k   Anesthesia Other Findings Day of surgery medications reviewed with the patient.  Reproductive/Obstetrics                          Anesthesia Physical Anesthesia Plan  ASA: II  Anesthesia Plan: Spinal and MAC   Post-op Pain Management:    Induction: Intravenous  Airway Management Planned: Nasal Cannula  Additional Equipment: None  Intra-op Plan:   Post-operative Plan:   Informed Consent: I have reviewed the patients History and Physical, chart, labs and discussed the procedure including the risks, benefits and alternatives for the proposed anesthesia with the patient or authorized representative who has indicated his/her understanding and acceptance.   Dental advisory given  Plan Discussed with: CRNA, Anesthesiologist and Surgeon  Anesthesia Plan Comments: (Discussed risks and benefits of and differences between spinal and general. Discussed risks of spinal including headache, backache, failure, bleeding, infection, and nerve damage.  Patient consents to spinal. Questions answered. Coagulation studies and platelet count acceptable.)        Anesthesia Quick Evaluation

## 2015-04-29 NOTE — Anesthesia Postprocedure Evaluation (Signed)
Anesthesia Post Note  Patient: Shaun Sorensonbrahim Geraghty  Procedure(s) Performed: Procedure(s) (LRB): LEFT TOTAL KNEE ARTHROPLASTY (Left)  Patient location during evaluation: PACU Anesthesia Type: Spinal and MAC Level of consciousness: oriented, awake and alert and awake Pain management: pain level controlled Vital Signs Assessment: post-procedure vital signs reviewed and stable Respiratory status: spontaneous breathing, respiratory function stable and patient connected to nasal cannula oxygen Cardiovascular status: blood pressure returned to baseline and stable Postop Assessment: no headache, no backache, spinal receding, patient able to bend at knees and no signs of nausea or vomiting Anesthetic complications: no    Last Vitals:  Filed Vitals:   04/29/15 1630 04/29/15 1645  BP: 128/85 146/85  Pulse: 50 68  Temp:  36.5 C  Resp: 13 15    Last Pain:  Filed Vitals:   04/29/15 1648  PainSc: 0-No pain                 Cecile HearingStephen Edward Takerra Lupinacci

## 2015-04-29 NOTE — Brief Op Note (Signed)
04/29/2015  12:10 PM  PATIENT:  [name]  59 y.o. male  PRE-OPERATIVE DIAGNOSIS:  Primary Osteoarthritis Left knee  POST-OPERATIVE DIAGNOSIS:  Primary Osteoarthritis Left knee  PROCEDURE:  Procedure(s): LEFT TOTAL KNEE ARTHROPLASTY (Left)  SURGEON:  Surgeon(s) and Role:    * Jodi GeraldsJohn Meric Joye, MD - Primary  PHYSICIAN ASSISTANT:   ASSISTANTS: bethune   ANESTHESIA:   spinal  EBL:  Total I/O In: 1000 [I.V.:1000] Out: -   BLOOD ADMINISTERED:none  DRAINS: (1 med) Hemovact drain(s) in the l knee with  Suction Open   LOCAL MEDICATIONS USED:  OTHER experel  SPECIMEN:  No Specimen  DISPOSITION OF SPECIMEN:  N/A  COUNTS:  YES  TOURNIQUET:  * Missing tourniquet times found for documented tourniquets in log:  [id number] *  DICTATION: .Other Dictation: Dictation Number [id number]  PLAN OF CARE: Admit to inpatient   PATIENT DISPOSITION:  PACU - hemodynamically stable.   Delay start of Pharmacological VTE agent (>24hrs) due to surgical blood loss or risk of bleeding: no

## 2015-04-30 LAB — BASIC METABOLIC PANEL
ANION GAP: 5 (ref 5–15)
BUN: 8 mg/dL (ref 6–20)
CALCIUM: 9.1 mg/dL (ref 8.9–10.3)
CHLORIDE: 105 mmol/L (ref 101–111)
CO2: 27 mmol/L (ref 22–32)
CREATININE: 0.86 mg/dL (ref 0.61–1.24)
GFR calc non Af Amer: >60 mL/min (ref >60)
Glucose, Bld: 137 mg/dL — ABNORMAL HIGH (ref 65–99)
Potassium: 4.4 mmol/L (ref 3.5–5.1)
SODIUM: 137 mmol/L (ref 135–145)

## 2015-04-30 LAB — CBC
HEMATOCRIT: 34.3 % — AB (ref 39.0–52.0)
HEMOGLOBIN: 11.1 g/dL — AB (ref 13.0–17.0)
MCH: 27.5 pg (ref 26.0–34.0)
MCHC: 32.4 g/dL (ref 30.0–36.0)
MCV: 84.9 fL (ref 78.0–100.0)
Platelets: 152 10*3/uL (ref 150–400)
RBC: 4.04 MIL/uL — AB (ref 4.22–5.81)
RDW: 13.1 % (ref 11.5–15.5)
WBC: 6.5 10*3/uL (ref 4.0–10.5)

## 2015-04-30 NOTE — Progress Notes (Signed)
Subjective: 1 Day Post-Op Procedure(s) (LRB): LEFT TOTAL KNEE ARTHROPLASTY (Left) Patient reports pain as moderate. Taking by mouth and voiding okay. Would like to go home this afternoon after physical therapy.  Objective: Vital signs in last 24 hours: Temp:  [97.1 F (36.2 C)-98.7 F (37.1 C)] 98.2 F (36.8 C) (12/17 0037) Pulse Rate:  [40-68] 63 (12/17 0037) Resp:  [9-20] 16 (12/17 0037) BP: (123-164)/(73-99) 136/83 mmHg (12/17 0037) SpO2:  [98 %-100 %] 100 % (12/17 0037)  Intake/Output from previous day: 12/16 0701 - 12/17 0700 In: 2320 [P.O.:120; I.V.:2200] Out: 1050 [Urine:550; Drains:500] Intake/Output this shift:     Recent Labs  04/30/15 0525  HGB 11.1*    Recent Labs  04/30/15 0525  WBC 6.5  RBC 4.04*  HCT 34.3*  PLT 152    Recent Labs  04/30/15 0525  NA 137  K 4.4  CL 105  CO2 27  BUN 8  CREATININE 0.86  GLUCOSE 137*  CALCIUM 9.1   No results for input(s): LABPT, INR in the last 72 hours. Left knee exam: Hemovac drain intact. Neurovascular intact Sensation intact distally Intact pulses distally Dorsiflexion/Plantar flexion intact Incision: dressing C/D/I Compartment soft  Assessment/Plan: 1 Day Post-Op Procedure(s) (LRB): LEFT TOTAL KNEE ARTHROPLASTY (Left) Plan: Up with therapy Discharge home with home health after physical therapy. Talked about the importance of range of motion of his left knee. Weight-bear as tolerated on left. Follow-up with Dr. Luiz BlareGraves in 2 weeks.  Mara Favero G 04/30/2015, 8:53 AM

## 2015-04-30 NOTE — Evaluation (Signed)
Physical Therapy Evaluation Patient Details Name: [name] MRN: [id number] DOB: 1955/11/14 Today's Date: 04/30/2015   History of Present Illness  59 y.o. male [facility]15 for elective L TKA.  Pt with significant PMHx of HTN, glaucoma, and eye surgery.    Clinical Impression  Pt is POD # 1 and moving well. He is min guard assist with RW and walked a good distance into the hallway.  I will need to practice stairs and finish reviewing his HEP before he leaves this afternoon.  Discharge is scheduled for this PM.   PT to follow acutely for deficits listed below.       Follow Up Recommendations Home health PT;Supervision for mobility/OOB    Equipment Recommendations  Rolling walker with 5" wheels;3in1 (PT)    Recommendations for Other Services   NA    Precautions / Restrictions Precautions Precautions: Knee Precaution Booklet Issued: Yes (comment) Precaution Comments: knee exercise handout given and reviewed with the pt.   Required Braces or Orthoses: Knee Immobilizer - Left Knee Immobilizer - Left: On when out of bed or walking Restrictions Weight Bearing Restrictions: No LLE Weight Bearing: Weight bearing as tolerated      Mobility  Bed Mobility Overal bed mobility: Needs Assistance Bed Mobility: Supine to Sit     Supine to sit: Min assist     General bed mobility comments: Min assist to help progress left leg over EOB.  Educated pt/wife on how to donn knee brace and when to use it.   Transfers Overall transfer level: Needs assistance Equipment used: Rolling walker (2 wheeled) Transfers: Sit to/from Stand Sit to Stand: Min guard         General transfer comment: Min guard assist to help stabilize pt for balance.  Heavy reliance on hands for transitions.  Verbal cues for safe hand placement.   Ambulation/Gait Ambulation/Gait assistance: Min guard Ambulation Distance (Feet): 140 Feet Assistive device: Rolling walker (2 wheeled) Gait  Pattern/deviations: Step-through pattern;Antalgic     General Gait Details: Mildly antalgic gait pattern, verbal cues for upright posture.           Balance Overall balance assessment: Needs assistance Sitting-balance support: Feet supported;No upper extremity supported Sitting balance-Leahy Scale: Good     Standing balance support: Bilateral upper extremity supported Standing balance-Leahy Scale: Fair                               Pertinent Vitals/Pain Pain Assessment: 0-10 Pain Score: 6  Pain Location: left knee Pain Descriptors / Indicators: Aching;Burning Pain Intervention(s): Limited activity within patient's tolerance;Monitored during session;Repositioned;Premedicated before session    Home Living Family/patient expects to be discharged to:: Private residence Living Arrangements: Spouse/significant other Available Help at Discharge: Family;Available 24 hours/day Type of Home: House         Home Equipment: None      Prior Function Level of Independence: Independent                  Extremity/Trunk Assessment   Upper Extremity Assessment: Overall WFL for tasks assessed           Lower Extremity Assessment: LLE deficits/detail   LLE Deficits / Details: left leg with normal post op pain and weakness.  Pt with at least 3/5 ankle, 2/5 knee, 2+/5 hip  Cervical / Trunk Assessment: Normal  Communication   Communication: No difficulties  Cognition Arousal/Alertness: Awake/alert Behavior During Therapy: [facility] for  tasks assessed/performed Overall Cognitive Status: Within Functional Limits for tasks assessed                         Exercises Total Joint Exercises Ankle Circles/Pumps: AROM;Both;20 reps Quad Sets: AROM;Left;10 reps Towel Squeeze: AROM;Both;10 reps Heel Slides: AAROM;Left;10 reps Goniometric ROM: 5-75 degrees of AAROM long sitting in recliner chair      Assessment/Plan    PT Assessment Patient needs continued  PT services  PT Diagnosis Difficulty walking;Abnormality of gait;Generalized weakness;Acute pain   PT Problem List Decreased strength;Decreased range of motion;Decreased activity tolerance;Decreased balance;Decreased mobility;Decreased knowledge of use of DME;Decreased knowledge of precautions;Pain  PT Treatment Interventions DME instruction;Gait training;Stair training;Functional mobility training;Therapeutic activities;Therapeutic exercise;Balance training;Neuromuscular re-education;Patient/family education;Manual techniques;Modalities   PT Goals (Current goals can be found in the Care Plan section) Acute Rehab PT Goals Patient Stated Goal: to be able to kneel to pray as he is Muslim PT Goal Formulation: With patient Time For Goal Achievement: 05/07/15 Potential to Achieve Goals: Good    Frequency 7X/week           End of Session Equipment Utilized During Treatment: Left knee immobilizer Activity Tolerance: Patient limited by pain Patient left: in chair;with call bell/phone within reach;with family/visitor present Nurse Communication: Mobility status         Time: 1610-9604 PT Time Calculation (min) (ACUTE ONLY): 36 min   Charges:   PT Evaluation $Initial PT Evaluation Tier I: 1 Procedure PT Treatments $Gait Training: 8-22 mins        Elea Holtzclaw B. Grafton Warzecha, PT, DPT [phone]   04/30/2015, 1:26 PM

## 2015-04-30 NOTE — Progress Notes (Addendum)
Discharge instruction gave to patient and his family. All question answered and patient had his equipment as well. He is ready to go.

## 2015-04-30 NOTE — Care Management Note (Signed)
Case Management Note  Patient Details  Name: [name] MRN: [id number] Date of Birth: [birth year]  Subjective/Objective:   59 yr old male s/p left total knee arthroplasty.   Action/Plan: Case manager spoke with patient concerning home health and DME needs at discharge. Choice was offered. Referral was called to [facility]one[facility], [facility]. Patient will receive Rolling Walker, 3in1 and CPM from [employer]. Patient will have family support at discharge.    Expected Discharge Date:    04/30/15              Expected Discharge Plan:  Home w Home Health Services  In-House Referral:     Discharge planning Services  CM Consult  Post Acute Care Choice:  Durable Medical Equipment, Home Health Choice offered to:     DME Arranged:  3-N-1, Walker rolling, CPM DME Agency:  [employer]  HH Arranged:  PT [facility]  Status of Service:  Completed, signed off  Medicare Important Message Given:    Date Medicare IM Given:    Medicare IM give by:    Date Additional Medicare IM Given:    Additional Medicare Important Message give by:     If discussed at Long Length of Stay Meetings, dates discussed:    Additional Comments:  Durenda GuthrieBrady, Arieona Swaggerty Naomi, RN 04/30/2015, 11:28 AM

## 2015-04-30 NOTE — Op Note (Signed)
NAMNeldon [name]:  [name], [name]           ACCOUNT NO.:  [id number]  MEDICAL RECORD NO.:  [id number]  LOCATION:  [id number]                        FACILITY:  [facility]  PHYSICIAN:  [name], M.D.   DATE OF BIRTH:  11-Oct-1955  DATE OF PROCEDURE:  04/29/2015 DATE OF DISCHARGE:                              OPERATIVE REPORT   PREOPERATIVE DIAGNOSIS:  End-stage degenerative joint disease, left knee with severe bone-on-bone change and subluxation.  POSTOPERATIVE DIAGNOSIS:  End-stage degenerative joint disease, left knee with severe bone-on-bone change and subluxation.  PROCEDURE:  Left total knee replacement with a Sigma system, size 4 femur, size 4 tibia, 15-mm bridging bearing, and a 38-mm all polyethylene patella.  SURGEON:  [name], M.D.  ASSISTANT:  Shaun LyJames [name], P.A.  ANESTHESIA:  General.  BRIEF HISTORY:  [name] is a 59 year old male with a long history of severe complaints of left knee pain.  He had severe bone-on-bone changes of the medial compartment.  He had severe varus malalignment. He had night pain and light activity pain, and after failure of all conservative care including injection, physical therapy and activity modification, he was taken to the operating room for left total knee replacement.  DESCRIPTION OF PROCEDURE:  The patient was taken to the operating room. After adequate anesthesia was obtained with general anesthetic, the patient was placed supine on the operating table.  The left leg was then prepped and draped in usual sterile fashion.  Following this, the leg was exsanguinated and blood pressure tourniquet was inflated to 300 mmHg.  Following this, a midline incision was made to the subcutaneous tissue down the level of the extensor mechanism and the medial parapatellar arthrotomy was undertaken.  Medial meniscus was removed. Lateral meniscus was removed.  Anterior and posterior cruciate, synovium of the anterior aspect of the femur,  retropatellar fat pad, and at this point, the attention was turned to the femur where an intramedullary pilot hole was drilled followed by a 4-degree valgus inclination, long alignment and this 11 mm of distal bone was cut at this point.  Once this was done, attention was turned towards sizing, sized to a 4. Anterior and posterior cuts were made, chamfers and box.  Attention was turned to the tibia, sized to a 4.  Rotational alignment was placed.  At this point, it was drilled and keeled.  Following this, the trial bearing was put in place, 15 was the appropriate size.  Range of motion was excellent, stability was excellent and at this point, attention was turned towards the patella, cut down to a level of 15 mm and a 38 paddle was chosen.  Lugs were drilled and the trial patella placed.  Excellent range of motion and stability were achieved.  At this point, the trial components were removed.  The knee was then copiously and thoroughly lavaged, pulsatile lavage, irrigation, and suctioned dry and the final components were then cemented into place size 4 tibia, size 4 femur, 15- mm bridging bearing, and a 38-mm all-polyethylene patella was placed and held with a clamp.  The bone cement was allowed to harden completely and all excess bone cement was removed, and following this, tourniquet was let down.  All bleeding was  controlled with electrocautery.  A trial 17.5 was trialed, that was too tight.  I went with a 15, excellent full extension, flexion was easy and stability was excellent.  At this point, the knee was irrigated, suctioned dry.  40 mL of 20 mL Exparel mixed with 20 mL of 0.5% Marcaine was instilled throughout the synovial reflection for postoperative anesthesia.  At this point, the wound was again irrigated.  Final poly was placed.  The range of motion and stability were checked.  Once we were satisfied with this, the medium Hemovac drain was placed and the medial parapatellar  arthrotomy was closed with 1 Vicryl running.  The skin was closed with 0 and 2-0 Vicryl and skin staples.  Sterile compressive dressing was applied.  The patient was taken to the recovery room, he was noted to be in satisfactory condition.  Estimated blood loss for the procedure was minimal.     [name], M.D.     [name]  D:  04/29/2015  T:  04/30/2015  Job:  [id number]

## 2015-04-30 NOTE — Care Management (Signed)
Utilization review completed. Alise Calais, RN Case Manager [phone]. 

## 2015-04-30 NOTE — Discharge Summary (Signed)
Patient ID: [name] MRN: [id number] DOB/AGE: 1955-09-28 59 y.o.  Admit date: 04/29/2015 Discharge date: 04/30/2015  Admission Diagnoses:  Principal Problem:   Primary osteoarthritis of left knee   Discharge Diagnoses:  Same  Past Medical History  Diagnosis Date  . Hypertension   . Glaucoma     Surgeries: Procedure(s): LEFT TOTAL KNEE ARTHROPLASTY on 04/29/2015   Discharged Condition: Improved  Hospital Course: [name] is an 59 y.o. male who was admitted 04/29/2015 for operative treatment ofPrimary osteoarthritis of left knee. Patient has severe unremitting pain that affects sleep, daily activities, and work/hobbies. After pre-op clearance the patient was taken to the operating room on 04/29/2015 and underwent  Procedure(s): LEFT TOTAL KNEE ARTHROPLASTY.    Patient was given perioperative antibiotics: Anti-infectives    Start     Dose/Rate Route Frequency Ordered Stop   04/29/15 1700  ceFAZolin (ANCEF) IVPB 2 g/50 mL premix     2 g 100 mL/hr over 30 Minutes Intravenous Every 6 hours 04/29/15 1658 04/29/15 2316   04/29/15 0537  ceFAZolin (ANCEF) IVPB 2 g/50 mL premix     2 g 100 mL/hr over 30 Minutes Intravenous On call to O.R. 04/29/15 0537 04/29/15 1000       Patient was given sequential compression devices, early ambulation, and chemoprophylaxis to prevent DVT.  Patient benefited maximally from hospital stay and there were no complications.    Recent vital signs: Patient Vitals for the past 24 hrs:  BP Temp Temp src Pulse Resp SpO2  04/30/15 0037 136/83 mmHg 98.2 F (36.8 C) Oral 63 16 100 %  04/29/15 2320 136/82 mmHg 98.7 F (37.1 C) Oral 63 16 98 %  04/29/15 1739 (!) 144/83 mmHg 97.1 F (36.2 C) Oral (!) 56 16 100 %  04/29/15 1645 (!) 146/85 mmHg 97.7 F (36.5 C) - 68 15 100 %  04/29/15 1630 128/85 mmHg - - (!) 50 13 100 %  04/29/15 1615 (!) 134/92 mmHg - - (!) 50 12 100 %  04/29/15 1600 132/82 mmHg - - (!) 45 13 100 %  04/29/15 1545  134/73 mmHg - - (!) 44 13 100 %  04/29/15 1530 123/79 mmHg - - (!) 48 11 100 %  04/29/15 1515 (!) 147/90 mmHg - - (!) 46 12 100 %  04/29/15 1500 (!) 139/92 mmHg - - (!) 51 11 100 %     Recent laboratory studies:  Recent Labs  04/30/15 0525  WBC 6.5  HGB 11.1*  HCT 34.3*  PLT 152  NA 137  K 4.4  CL 105  CO2 27  BUN 8  CREATININE 0.86  GLUCOSE 137*  CALCIUM 9.1     Discharge Medications:     Medication List    STOP taking these medications        ibuprofen 200 MG tablet  Commonly known as:  ADVIL,MOTRIN      TAKE these medications        aspirin EC 325 MG tablet  Take 1 tablet (325 mg total) by mouth 2 (two) times daily after a meal. Take x 1 month post op to decrease risk of blood clots.     dorzolamide-timolol 22.3-6.8 MG/ML ophthalmic solution  Commonly known as:  COSOPT  Place 1 drop into both eyes 2 (two) times daily.     lisinopril 20 MG tablet  Commonly known as:  PRINIVIL,ZESTRIL  Take 20 mg by mouth daily.     methocarbamol 750 MG tablet  Commonly known as:  WUJWJXB-147  Take 1 tablet (750 mg total) by mouth every 8 (eight) hours as needed for muscle spasms.     oxyCODONE-acetaminophen 5-325 MG tablet  Commonly known as:  PERCOCET/ROXICET  Take 1-2 tablets by mouth every 4 (four) hours as needed for severe pain.        Diagnostic Studies: Dg Chest 2 View  04/19/2015  CLINICAL DATA:  Preoperative evaluation for knee arthroplasty. Hypertension. EXAM: CHEST  2 VIEW COMPARISON:  March 06, 2013 FINDINGS: There is a 2.7 x 1.4 cm opacity in the medial right apex, not appreciable on prior study. The lungs elsewhere clear. Heart size and pulmonary vascularity are normal. No adenopathy. No bone lesions. IMPRESSION: Opacity in the medial right apex measuring 2.7 x 1.4 cm. This area is more prominent than on prior study. Given the possibility of parenchymal lung mass in this area, correlation with contrast enhanced chest CT is advised to further evaluate.  Lungs elsewhere clear. No adenopathy appreciable. These results will be called to the ordering clinician or representative by the Radiologist Assistant, and communication documented in the PACS or zVision Dashboard. Electronically Signed   By: Bretta Bang III M.D.   On: 04/19/2015 10:28   Ct Chest W Contrast  04/28/2015  CLINICAL DATA:  Medial right apical lung opacity on recent chest radiograph. EXAM: CT CHEST WITH CONTRAST TECHNIQUE: Multidetector CT imaging of the chest was performed during intravenous contrast administration. CONTRAST:  75mL ISOVUE-300 IOPAMIDOL (ISOVUE-300) INJECTION 61% COMPARISON:  04/19/2015 chest radiograph. FINDINGS: Mediastinum/Nodes: Normal heart size. No pericardial fluid/thickening. Atherosclerotic nonaneurysmal thoracic aorta. Normal caliber pulmonary arteries. No evidence of central pulmonary emboli. Normal visualized thyroid. Normal esophagus. No pathologically enlarged axillary, mediastinal or hilar lymph nodes. Lungs/Pleura: No pneumothorax. No pleural effusion. No acute consolidative airspace disease. There is a 1.0 x 1.0 cm subsolid pulmonary nodule in the posterior right upper lobe with 0.3 cm solid component, with slight tenting of the right major fissure (series 4/ image 24). There is a 4 mm solid left upper lobe pulmonary nodule (4/29). No additional significant pulmonary nodules or lung masses. Tiny calcified granuloma in the posterior apical right upper lobe. Upper abdomen: Partially visualized medial upper left renal cyst measuring at least 2.9 cm in size, the visualized portions of which demonstrate simple fluid density. Musculoskeletal: No aggressive appearing focal osseous lesions. Mild degenerative changes in the thoracic spine. IMPRESSION: 1. Subsolid 1.0 cm right upper lobe pulmonary nodule with associated slight tenting of the right major fissure. Initial follow-up by chest CT without contrast is recommended in 3 months to confirm persistence. This  recommendation follows the consensus statement: Recommendations for the Management of Subsolid Pulmonary Nodules Detected at CT: A Statement from the Fleischner Society as published in Radiology 2013; 266:304-317. 2. Solitary 4 mm left upper lobe solid pulmonary nodule. If the patient is at high risk for bronchogenic carcinoma, follow-up chest CT at 1 year is recommended. If the patient is at low risk, no follow-up is needed. This recommendation follows the consensus statement: Guidelines for Management of Small Pulmonary Nodules Detected on CT Scans: A Statement from the Fleischner Society as published in Radiology 2005; 237:395-400. 3. Please note that the opacity questioned on the 04/19/2015 chest radiograph in the medial apical right upper lobe correlates with an normal mediastinal vascular shadow. 4. No thoracic adenopathy. Electronically Signed   By: Delbert Phenix M.D.   On: 04/28/2015 15:08    Disposition: 01-Home or Self Care      Discharge Instructions    CPM  Complete by:  As directed   Continuous passive motion machine (CPM):      Use the CPM from 0 to 60 for 8 hours per day.      You may increase by 5-10 per day.  You may break it up into 2 or 3 sessions per day.      Use CPM for 1-2 weeks or until you are told to stop.     Call MD / Call 911    Complete by:  As directed   If you experience chest pain or shortness of breath, CALL 911 and be transported to the hospital emergency room.  If you develope a fever above 101 F, pus (white drainage) or increased drainage or redness at the wound, or calf pain, call your surgeon's office.     Constipation Prevention    Complete by:  As directed   Drink plenty of fluids.  Prune juice may be helpful.  You may use a stool softener, such as Colace (over the counter) 100 mg twice a day.  Use MiraLax (over the counter) for constipation as needed.     Diet general    Complete by:  As directed      Do not put a pillow under the knee. Place it  under the heel.    Complete by:  As directed      Increase activity slowly as tolerated    Complete by:  As directed      Weight bearing as tolerated    Complete by:  As directed   Laterality:  right  Extremity:  Lower           Follow-up Information    Follow up with GRAVES,JOHN L, MD. Schedule an appointment as soon as possible for a visit in 2 weeks.   Specialty:  Orthopedic Surgery   Contact information:   [address] [place] [address] [phone]       [facility] [place]   Specialty:  Home Health Services   Why:  Someone [facility] will contact you concerning start time for therapy.   Contact information:   [employer] [place] [address] [phone]        Signed: Matthew FolksBETHUNE,Wm Sahagun G 04/30/2015, 2:52 PM

## 2015-05-01 NOTE — Progress Notes (Signed)
Physical Therapy Treatment Patient Details Name: [name] MRN: [id number] DOB: May 26, 1955 Today's Date: 05/01/2015    History of Present Illness 59 y.o. male admitted to [facility] on 04/29/15 for elective L TKA.  Pt with significant PMHx of HTN, glaucoma, and eye surgery.      PT Comments     Pt is progressing well with his mobility.  He was able to demonstrate safe ability to ascend and descend steps simulating home access.  All of his HEP exercises have been reviewed.  PT will follow acutely, however, pt is planning on d/c ing home this PM.    Follow Up Recommendations  Home health PT;Supervision for mobility/OOB     Equipment Recommendations  Rolling walker with 5" wheels;3in1 (PT)    Recommendations for Other Services   NA     Precautions / Restrictions Precautions Precautions: Knee Precaution Booklet Issued: Yes (comment) Precaution Comments: knee exercise handout given and reviewed with the pt.   Required Braces or Orthoses: Knee Immobilizer - Left Knee Immobilizer - Left: On when out of bed or walking Restrictions LLE Weight Bearing: Weight bearing as tolerated    Mobility  Bed Mobility               General bed mobility comments: Pt in recliner chair  Transfers Overall transfer level: Needs assistance Equipment used: Rolling walker (2 wheeled) Transfers: Sit to/from Stand Sit to Stand: Supervision         General transfer comment: supervision for safety due to slow speed of transitions and heavy reliance on hands for support.   Ambulation/Gait Ambulation/Gait assistance: Supervision Ambulation Distance (Feet): 120 Feet Assistive device: Rolling walker (2 wheeled) Gait Pattern/deviations: Step-through pattern;Antalgic     General Gait Details: Mildly antalgic gait pattern, PT reinforced good heel to toe pattern and upright posture.    Stairs Stairs: Yes Stairs assistance: Supervision Stair Management: One rail Left;Step to  pattern;Forwards Number of Stairs: 5 General stair comments: Pt was able to demonstrate the ability to ascend and descend stairs simulating home entry by reaching with both hands on left railing and walking forward up/down the steps.  Pt educated on safest LE sequencing.          Balance Overall balance assessment: Needs assistance Sitting-balance support: Feet supported;No upper extremity supported Sitting balance-Leahy Scale: Good     Standing balance support: Bilateral upper extremity supported Standing balance-Leahy Scale: Fair                      Cognition Arousal/Alertness: Awake/alert Behavior During Therapy: WFL for tasks assessed/performed Overall Cognitive Status: Within Functional Limits for tasks assessed                      Exercises Total Joint Exercises Short Arc Quad: AROM;Left;10 reps Hip ABduction/ADduction: AROM;Left;10 reps Straight Leg Raises: AAROM;Left;10 reps Long Arc Quad: AROM;Left;10 reps Knee Flexion: AROM;AAROM;Left;10 reps        Pertinent Vitals/Pain Pain Assessment: 0-10 Pain Score: 4  Pain Location: left knee Pain Descriptors / Indicators: Aching Pain Intervention(s): Limited activity within patient's tolerance;Monitored during session;Repositioned           PT Goals (current goals can now be found in the care plan section) Acute Rehab PT Goals Patient Stated Goal: to be able to kneel to pray as he is Muslim Progress towards PT goals: Progressing toward goals    Frequency  7X/week    PT Plan Current plan remains appropriate  End of Session Equipment Utilized During Treatment: Left knee immobilizer Activity Tolerance: Patient limited by pain Patient left: in chair;with call bell/phone within reach;with family/visitor present     Time: [id number] PT Time Calculation (min) (ACUTE ONLY): 34 min  Charges:    1 gait, 1 therapeutic exercise                     Itzell Bendavid B. Scorpio Fortin, PT, DPT  [phone]   05/01/2015, 1:21 PM

## 2015-05-02 ENCOUNTER — Encounter (HOSPITAL_COMMUNITY): Payer: Self-pay | Admitting: Orthopedic Surgery

## 2017-12-03 IMAGING — CT CT CHEST W/ CM
2 of 3 series · 15 of 30 positions shown, 17 images · IV contrast (75CC ISOVUE 300)
Comparison: 04/19/2015 chest radiograph.

CLINICAL DATA: Medial right apical lung opacity on recent chest
radiograph.

EXAM:
CT CHEST WITH CONTRAST
TECHNIQUE: Multidetector CT imaging of the chest was performed during
intravenous contrast administration.
CONTRAST:  75mL FI5EY3-QYY IOPAMIDOL (FI5EY3-QYY) INJECTION 61%

[Series 3: chest with · axial · 0.70mm/px · z∈[-268,-28]mm · 7 of 64 slices shown, 9 images]
[im 8/64  mediastinal]
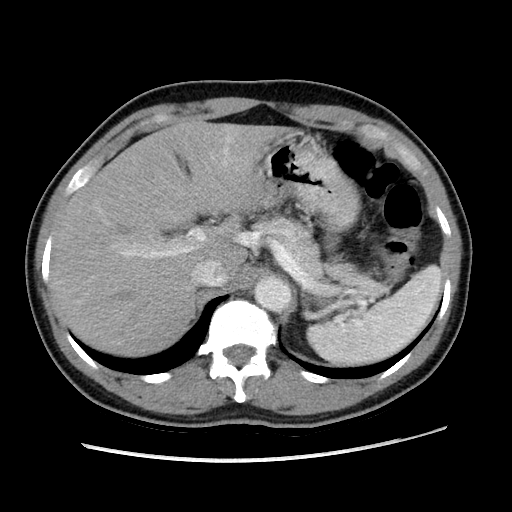
[im 8/64  lung]
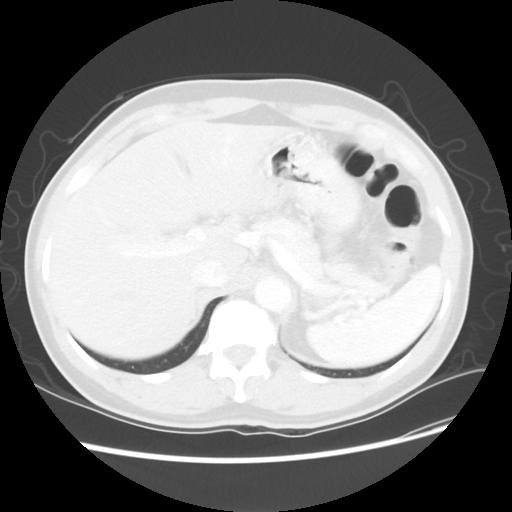
[im 16/64  lung]
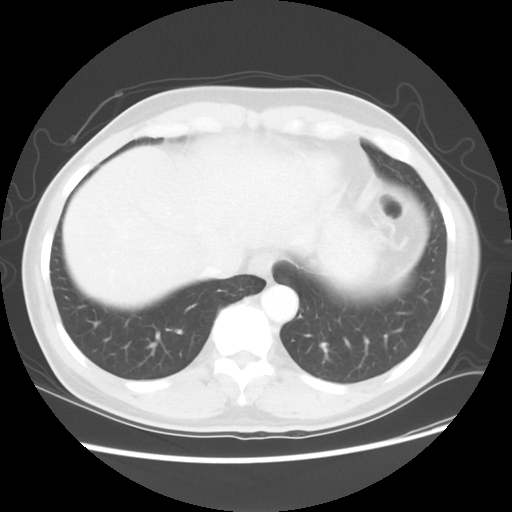
[im 24/64  lung]
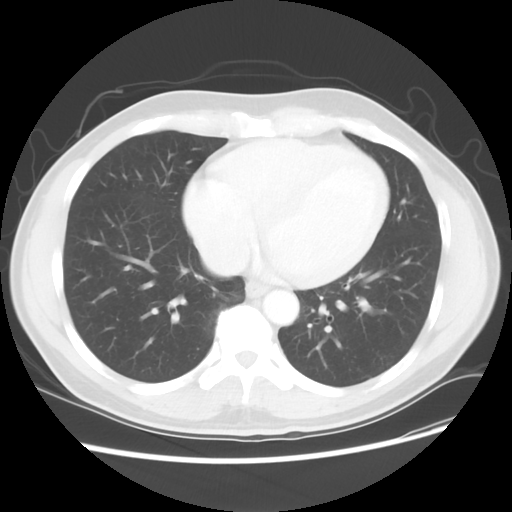
[im 32/64  lung]
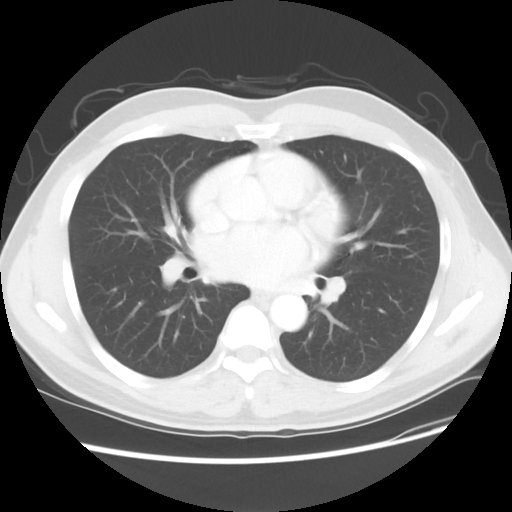
[im 40/64  mediastinal]
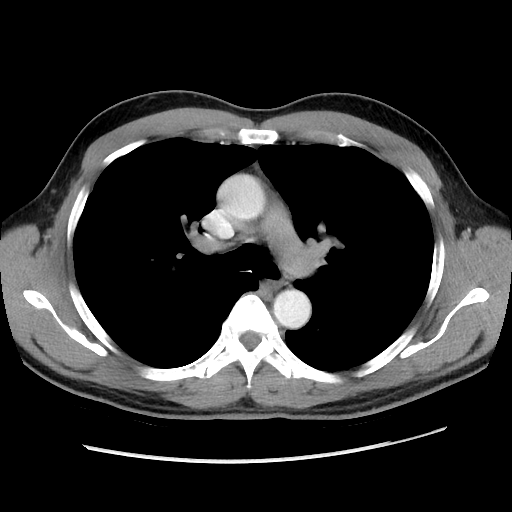
[im 40/64  lung]
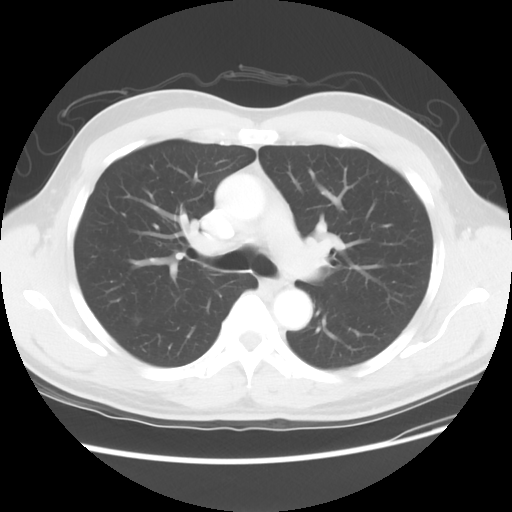
[im 48/64  lung]
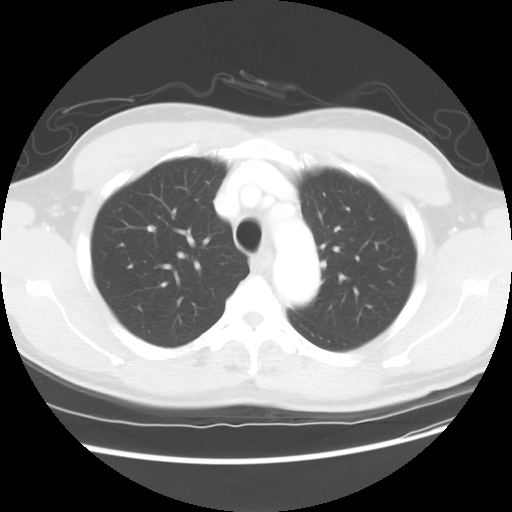
[im 56/64  lung]
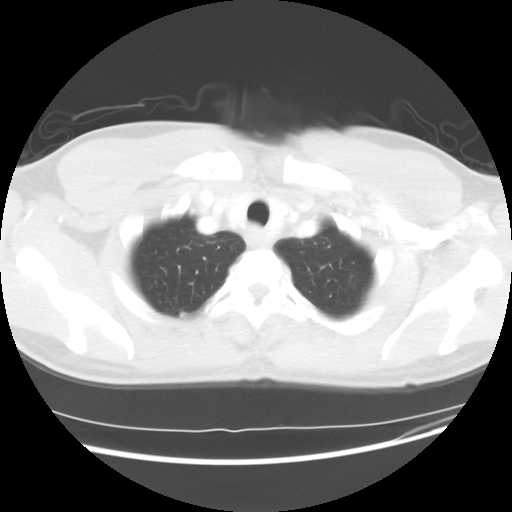

[Series 602: sagittal body · sagittal · 0.70mm/px · 8 of 145 slices shown]
[im 17/145  mediastinal]
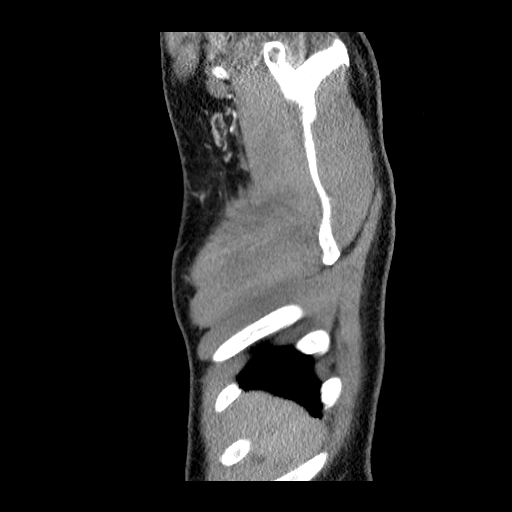
[im 33/145  mediastinal]
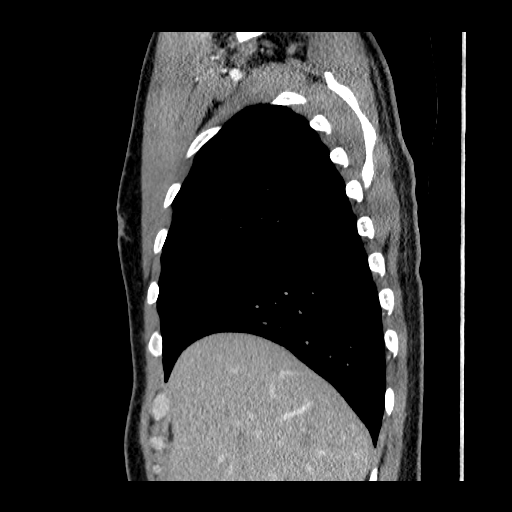
[im 49/145  mediastinal]
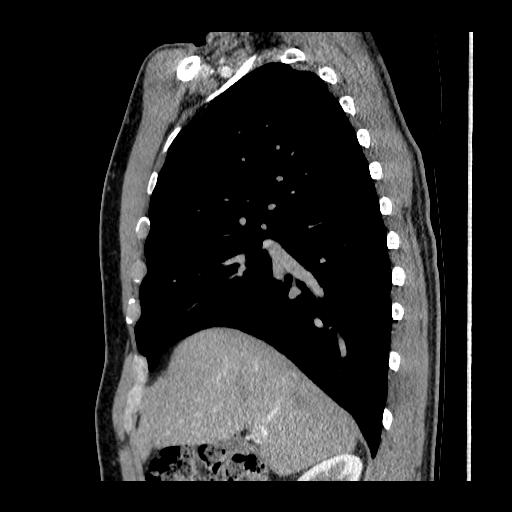
[im 65/145  mediastinal]
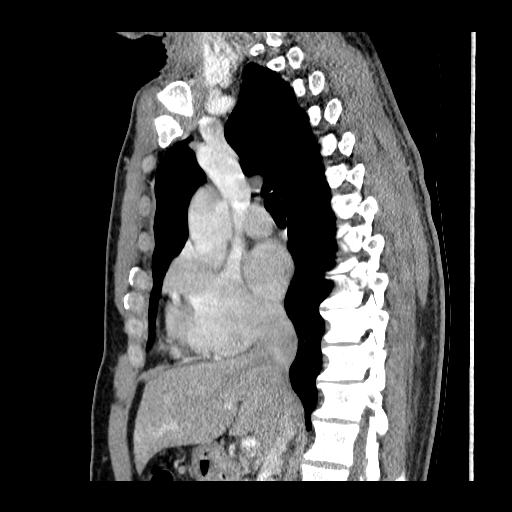
[im 81/145  mediastinal]
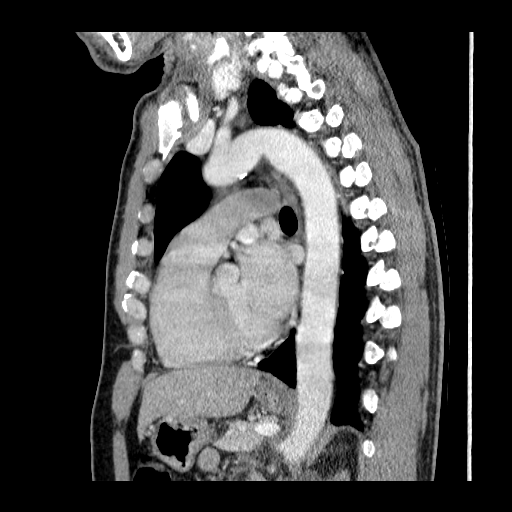
[im 97/145  mediastinal]
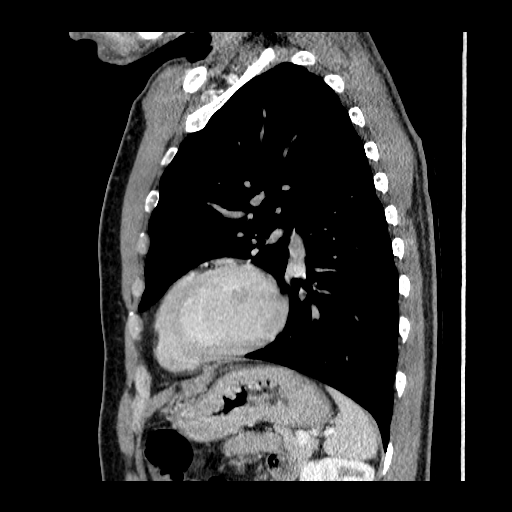
[im 113/145  mediastinal]
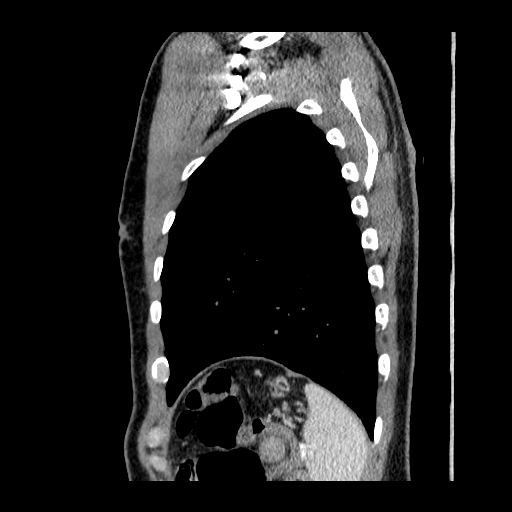
[im 129/145  mediastinal]
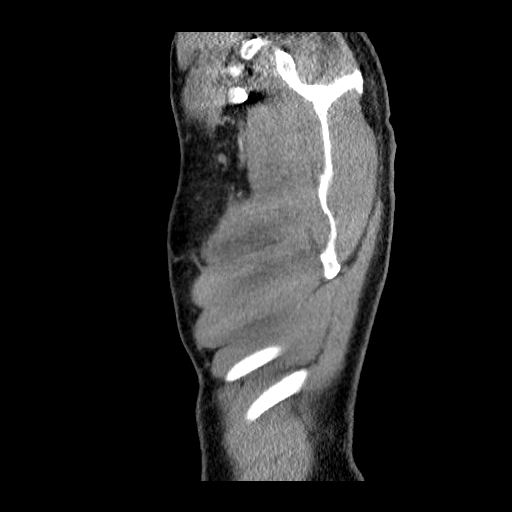

[15 of 30 positions shown; findings below may reference images not displayed]

FINDINGS: Mediastinum/Nodes: Normal heart size. No pericardial
fluid/thickening. Atherosclerotic nonaneurysmal thoracic aorta.
Normal caliber pulmonary arteries. No evidence of central pulmonary
emboli. Normal visualized thyroid. Normal esophagus. No
pathologically enlarged axillary, mediastinal or hilar lymph nodes.

Lungs/Pleura: No pneumothorax. No pleural effusion. No acute
consolidative airspace disease. There is a 1.0 x 1.0 cm subsolid
pulmonary nodule in the posterior right upper lobe with 0.3 cm solid
component, with slight tenting of the right major fissure (series 4/
image 24). There is a 4 mm solid left upper lobe pulmonary nodule
([DATE]). No additional significant pulmonary nodules or lung masses.
Tiny calcified granuloma in the posterior apical right upper lobe.

Upper abdomen: Partially visualized medial upper left renal cyst
measuring at least 2.9 cm in size, the visualized portions of which
demonstrate simple fluid density.

Musculoskeletal: No aggressive appearing focal osseous lesions. Mild
degenerative changes in the thoracic spine.
IMPRESSION: 1. Subsolid 1.0 cm right upper lobe pulmonary nodule with associated
slight tenting of the right major fissure. Initial follow-up by
chest CT without contrast is recommended in 3 months to confirm
persistence. This recommendation follows the consensus statement:
Recommendations for the Management of Subsolid Pulmonary Nodules
Detected at CT: A Statement from the [HOSPITAL] as published
2. Solitary 4 mm left upper lobe solid pulmonary nodule. If the
patient is at high risk for bronchogenic carcinoma, follow-up chest
CT at 1 year is recommended. If the patient is at low risk, no
follow-up is needed. This recommendation follows the consensus
statement: Guidelines for Management of Small Pulmonary Nodules
Detected on CT Scans: A Statement from the [HOSPITAL] as
published in Radiology 5228; [DATE].
3. Please note that the opacity questioned on the 04/19/2015 chest
radiograph in the medial apical right upper lobe correlates with an
normal mediastinal vascular shadow.
4. No thoracic adenopathy.

## 2018-11-25 ENCOUNTER — Encounter: Payer: Self-pay | Admitting: Gastroenterology

## 2019-01-02 ENCOUNTER — Encounter: Payer: BLUE CROSS/BLUE SHIELD | Admitting: Gastroenterology

## 2022-11-16 ENCOUNTER — Ambulatory Visit: Payer: Self-pay | Admitting: Cardiology

## 2022-11-16 NOTE — Progress Notes (Unsigned)
Primary Physician/Referring:  Rometta Emery, MD  Patient ID: [name], male    DOB: 1956/05/09, 67 y.o.   MRN: [id number]  No chief complaint on file.  HPI:    [name]  is a 67 y.o. ***  Past Medical History:  Diagnosis Date   Glaucoma    Hypertension    Past Surgical History:  Procedure Laterality Date   EYE SURGERY     GLAUCOMA SURGERY     TOTAL KNEE ARTHROPLASTY Left 04/29/2015   Procedure: LEFT TOTAL KNEE ARTHROPLASTY;  Surgeon: Jodi Geralds, MD;  Location: [facility] OR;  Service: Orthopedics;  Laterality: Left;   No family history on file.  Social History   Tobacco Use   Smoking status: Never   Smokeless tobacco: Not on file  Substance Use Topics   Alcohol use: No   Marital Status: Married  ROS  ***ROS Objective      04/30/2015   12:37 AM 04/29/2015   11:20 PM 04/29/2015    5:39 PM  Vitals with BMI  Systolic 136 136 409  Diastolic 83 82 83  Pulse 63 63 56   There were no vitals taken for this visit.   ***Physical Exam  Laboratory examination:   No results for input(s): "NA", "K", "CL", "CO2", "GLUCOSE", "BUN", "CREATININE", "CALCIUM", "GFRNONAA", "GFRAA" in the last 8760 hours.  Lab Results  Component Value Date   GLUCOSE 137 (H) 04/30/2015   NA 137 04/30/2015   K 4.4 04/30/2015   CL 105 04/30/2015   CO2 27 04/30/2015   BUN 8 04/30/2015   CREATININE 0.86 04/30/2015   GFRNONAA >60 04/30/2015   CALCIUM 9.1 04/30/2015   PROT 7.1 04/19/2015   ALBUMIN 3.8 04/19/2015   BILITOT 0.6 04/19/2015   ALKPHOS 80 04/19/2015   AST 36 04/19/2015   ALT 28 04/19/2015   ANIONGAP 5 04/30/2015      Lab Results  Component Value Date   ALT 28 04/19/2015   AST 36 04/19/2015   ALKPHOS 80 04/19/2015   BILITOT 0.6 04/19/2015       Latest Ref Rng & Units 04/30/2015    5:25 AM 04/19/2015    9:44 AM  CBC  WBC 4.0 - 10.5 K/uL 6.5  6.0   Hemoglobin 13.0 - 17.0 g/dL 81.1  91.4   Hematocrit 39.0 - 52.0 % 34.3  43.1   Platelets 150 - 400 K/uL  152  172        Latest Ref Rng & Units 04/19/2015    9:44 AM  Hepatic Function  Total Protein 6.5 - 8.1 g/dL 7.1   Albumin 3.5 - 5.0 g/dL 3.8   AST 15 - 41 U/L 36   ALT 17 - 63 U/L 28   Alk Phosphatase 38 - 126 U/L 80   Total Bilirubin 0.3 - 1.2 mg/dL 0.6     Lipid Panel No results for input(s): "CHOL", "TRIG", "LDLCALC", "VLDL", "HDL", "CHOLHDL", "LDLDIRECT" in the last 8760 hours.  HEMOGLOBIN A1C No results found for: "HGBA1C", "MPG" TSH No results for input(s): "TSH" in the last 8760 hours.  External labs:   Labs 11/21/2021:  Total cholesterol 184, triglycerides 67, HDL 61, LDL 110.  Non-HDL cholesterol 123.  Hb 14.8/HCT 46.4, platelets 164, normal indicis.  Radiology:   CT angiogram chest 04/28/2015: Mediastinum/Nodes: Normal heart size. No pericardial fluid/thickening. Atherosclerotic nonaneurysmal thoracic aorta. Normal caliber pulmonary arteries. No evidence of central pulmonary emboli.  Subsolid 1.0 cm right upper lobe pulmonary nodule with associated slight tenting  of the right major fissure. Initial follow-up by chest CT without contrast is recommended in 3 months to confirm persistence. Upper abdomen: Partially visualized medial upper left renal cyst measuring at least 2.9 cm in size, the visualized portions of which demonstrate simple fluid density.  Cardiac Studies:   NA  EKG:   ***   Medications and allergies  No Known Allergies   Medication list   Current Outpatient Medications:    aspirin EC 325 MG tablet, Take 1 tablet (325 mg total) by mouth 2 (two) times daily after a meal. Take x 1 month post op to decrease risk of blood clots., Disp: 60 tablet, Rfl: 0   dorzolamide-timolol (COSOPT) 22.3-6.8 MG/ML ophthalmic solution, Place 1 drop into both eyes 2 (two) times daily. , Disp: , Rfl:    lisinopril (PRINIVIL,ZESTRIL) 20 MG tablet, Take 20 mg by mouth daily., Disp: , Rfl: 2   methocarbamol (ROBAXIN-750) 750 MG tablet, Take 1 tablet (750 mg total)  by mouth every 8 (eight) hours as needed for muscle spasms., Disp: 60 tablet, Rfl: 0   oxyCODONE-acetaminophen (PERCOCET/ROXICET) 5-325 MG tablet, Take 1-2 tablets by mouth every 4 (four) hours as needed for severe pain., Disp: 60 tablet, Rfl: 0  Assessment     ICD-10-CM   1. Palpitations  R00.2     2. Primary hypertension  I10     3. Aortic atherosclerosis (HCC)  I70.0     4. Mild hypercholesterolemia  E78.00        No orders of the defined types were placed in this encounter.   No orders of the defined types were placed in this encounter.   There are no discontinued medications.   Recommendations:   [name] is a 67 y.o.  ***    [name], MD, [facility] 11/16/2022, 6:36 AM Office: [phone]

## 2023-02-04 ENCOUNTER — Ambulatory Visit: Payer: Self-pay | Admitting: Cardiology
